# Patient Record
Sex: Female | Born: 2000 | Race: Black or African American | Hispanic: No | Marital: Single | Smoking: Never smoker
Health system: Southern US, Community
[De-identification: ages and names within clinical notes are randomized; demographics above are authoritative.]

## PROBLEM LIST (undated history)

## (undated) ENCOUNTER — Emergency Department (HOSPITAL_COMMUNITY): Admission: EM | Payer: Medicaid Other | Source: Home / Self Care

## (undated) DIAGNOSIS — L309 Dermatitis, unspecified: Secondary | ICD-10-CM

## (undated) DIAGNOSIS — J45909 Unspecified asthma, uncomplicated: Secondary | ICD-10-CM

---
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
## 2023-03-31 ENCOUNTER — Emergency Department
Admission: EM | Admit: 2023-03-31 | Discharge: 2023-03-31 | Disposition: A | Discharge status: Home / Self Care | Attending: Emergency Medicine | Admitting: Emergency Medicine

## 2023-03-31 ENCOUNTER — Other Ambulatory Visit: Payer: Self-pay

## 2023-03-31 DIAGNOSIS — J039 Acute tonsillitis, unspecified: Secondary | ICD-10-CM | POA: Insufficient documentation

## 2023-03-31 DIAGNOSIS — J029 Acute pharyngitis, unspecified: Secondary | ICD-10-CM

## 2023-03-31 LAB — CBC
HCT: 37.8 % (ref 36.0–46.0)
Hemoglobin: 12.8 g/dL (ref 12.0–15.0)
MCH: 29.2 pg (ref 26.0–34.0)
MCHC: 33.9 g/dL (ref 30.0–36.0)
MCV: 86.3 fL (ref 80.0–100.0)
Platelets: 235 10*3/uL (ref 150–400)
RBC: 4.38 MIL/uL (ref 3.87–5.11)
RDW: 13.2 % (ref 11.5–15.5)
WBC: 8.8 10*3/uL (ref 4.0–10.5)
nRBC: 0 % (ref 0.0–0.2)

## 2023-03-31 LAB — GROUP A STREP BY PCR: Group A Strep by PCR: NOT DETECTED

## 2023-03-31 LAB — MONONUCLEOSIS SCREEN: Mono Screen: NEGATIVE

## 2023-03-31 MED ORDER — PENICILLIN G BENZATHINE 1200000 UNIT/2ML IM SUSY
600000.0000 [IU] | PREFILLED_SYRINGE | Freq: Once | INTRAMUSCULAR | Status: AC
  Administered 2023-03-31: 600000 [IU] via INTRAMUSCULAR
  Filled 2023-03-31: qty 2

## 2023-03-31 MED ORDER — LIDOCAINE VISCOUS HCL 2 % MT SOLN
15.0000 mL | Freq: Once | OROMUCOSAL | Status: AC
  Administered 2023-03-31: 15 mL via OROMUCOSAL
  Filled 2023-03-31: qty 15

## 2023-03-31 NOTE — Discharge Instructions (Addendum)
 Your evaluated in the ED for sore throat.  Your rapid strep test is negative.  Your mono screening and strep culture is still pending.  If these are positive you will receive a call with an update of these results.  A penicillin injection was administered given your physical exam being consistent with strep pharyngitis.  Please follow-up with ENT for further evaluation.

## 2023-03-31 NOTE — ED Triage Notes (Signed)
 Pt sts that she has been having enlarged tonsil for many moths and keeps on getting a sore throat. Pt has a referral to see an ENT but has not made an apt.

## 2023-03-31 NOTE — ED Provider Notes (Signed)
 [facility] Emergency Department Provider Note     Event Date/Time   First MD Initiated Contact with Patient 03/31/23 1142     (approximate)   History   Sore Throat   HPI  Laura Shelton is a 23 y.o. female presents to the ED for evaluation of sore throat x 1 week.  Patient reports the sore throat has been ongoing for a while now and she has been evaluated by her school doctor who has tested her for strep and mononucleosis in the past and nothing has been positive.  She reports intermittent pain with swallowing and discomfort.  She reports she has a ENT referral but has yet to make an appointment.  Denies fever and chills.     Physical Exam   Triage Vital Signs: ED Triage Vitals  Encounter Vitals Group     BP 03/31/23 1128 113/84     Systolic BP Percentile --      Diastolic BP Percentile --      Pulse Rate 03/31/23 1128 (!) 117     Resp 03/31/23 1128 17     Temp 03/31/23 1128 97.8 F (36.6 C)     Temp Source 03/31/23 1128 Oral     SpO2 03/31/23 1128 98 %     Weight 03/31/23 1129 107 lb (48.5 kg)     Height 03/31/23 1129 5\' 2"  (1.575 m)     Head Circumference --      Peak Flow --      Pain Score 03/31/23 1129 6     Pain Loc --      Pain Education --      Exclude from Growth Chart --     Most recent vital signs: Vitals:   03/31/23 1128  BP: 113/84  Pulse: (!) 117  Resp: 17  Temp: 97.8 F (36.6 C)  SpO2: 98%    General: Well appearing. Alert and oriented. INAD.  Speaking in complete sentences. Head:  NCAT.  Eyes:  PERRLA. EOMI.  Throat: Oropharynx clear. No erythema. Exudates noted bilaterally. Tonsils are enlarged bilaterally. Uvula is midline. CV:  Good peripheral perfusion. RRR.  RESP:  Normal effort.   ED Results / Procedures / Treatments   Labs (all labs ordered are listed, but only abnormal results are displayed) Labs Reviewed  GROUP A STREP BY PCR  CULTURE, GROUP A STREP [facility])  CBC  MONONUCLEOSIS SCREEN   No  results found.  PROCEDURES:  Critical Care performed: No  Procedures  MEDICATIONS ORDERED IN ED: Medications  lidocaine (XYLOCAINE) 2 % viscous mouth solution 15 mL (15 mLs Mouth/Throat Given 03/31/23 1256)  penicillin g benzathine (BICILLIN LA) 1200000 UNIT/2ML injection 600,000 Units (600,000 Units Intramuscular Given 03/31/23 1359)    IMPRESSION / MDM / ASSESSMENT AND PLAN / ED COURSE  I reviewed the triage vital signs and the nursing notes.                               23 y.o. female presents to the emergency department for evaluation and treatment of acute on chronic sore throat. See HPI for further details.   Differential diagnosis includes, but is not limited to strep pharyngitis, tonsillitis, tonsil stones, mono  Patient's presentation is most consistent with acute complicated illness / injury requiring diagnostic workup.  Patient is alert and oriented.  She is hemodynamically stable and afebrile.  Physical exam findings are as stated above and pertinent for  bilateral exudates and tonsil enlargement.  On history intake we discussed if she has ever had tonsil stones as presentation is very similar.  She reports she has had this in the past not recently.  Rapid strep test is negative, mono screening was negative.  Lab work is reassuring.  Given her exudates, I will treat with penicillin injection and obtain a culture group A strep.   Lidocaine viscous was administered for discomfort.  Thorough discussion with patient was had for follow-up with ENT for further evaluation.  Patient verbalized understanding and reports will appointment tomorrow.  Patient is in stable condition for discharge home.  ED return precautions are discussed. All questions and concerns were addressed during this ED visit.     FINAL CLINICAL IMPRESSION(S) / ED DIAGNOSES   Final diagnoses:  Sore throat  Tonsillitis    Rx / DC Orders   ED Discharge Orders     None        Note:  This document was  prepared using Dragon voice recognition software and may include unintentional dictation errors.    Romeo Apple, Artis Beggs A, PA-C 03/31/23 1633    Sharman Cheek, MD 03/31/23 [phone]

## 2023-04-03 LAB — CULTURE, GROUP A STREP (THRC)

## 2023-05-20 IMAGING — CR DG CHEST 2V
2 series · 2 of 2 positions shown · non-contrast
Comparison: None.

CLINICAL DATA: Pleuritic chest pain with cough and shortness of
breath.

EXAM:
CHEST - 2 VIEW

[chest pa]
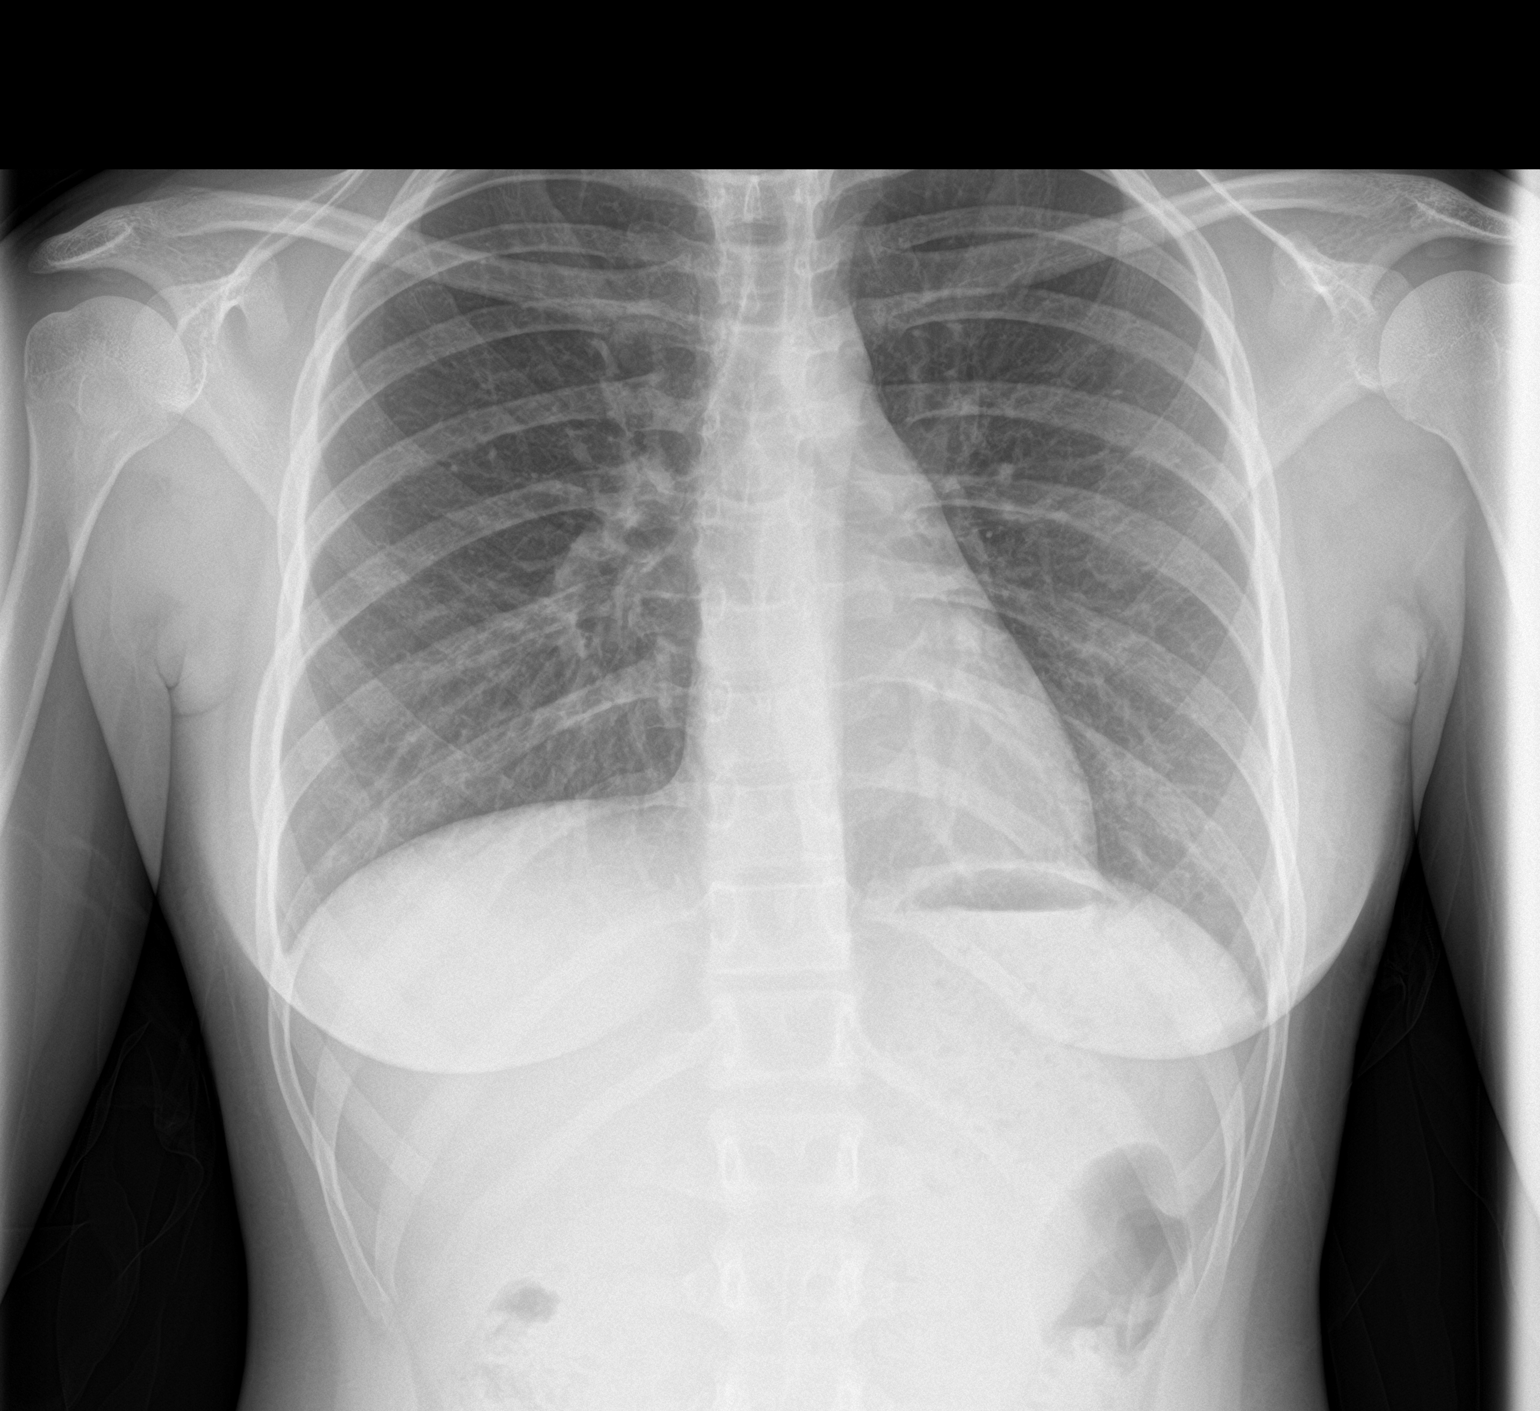

[chest lat]
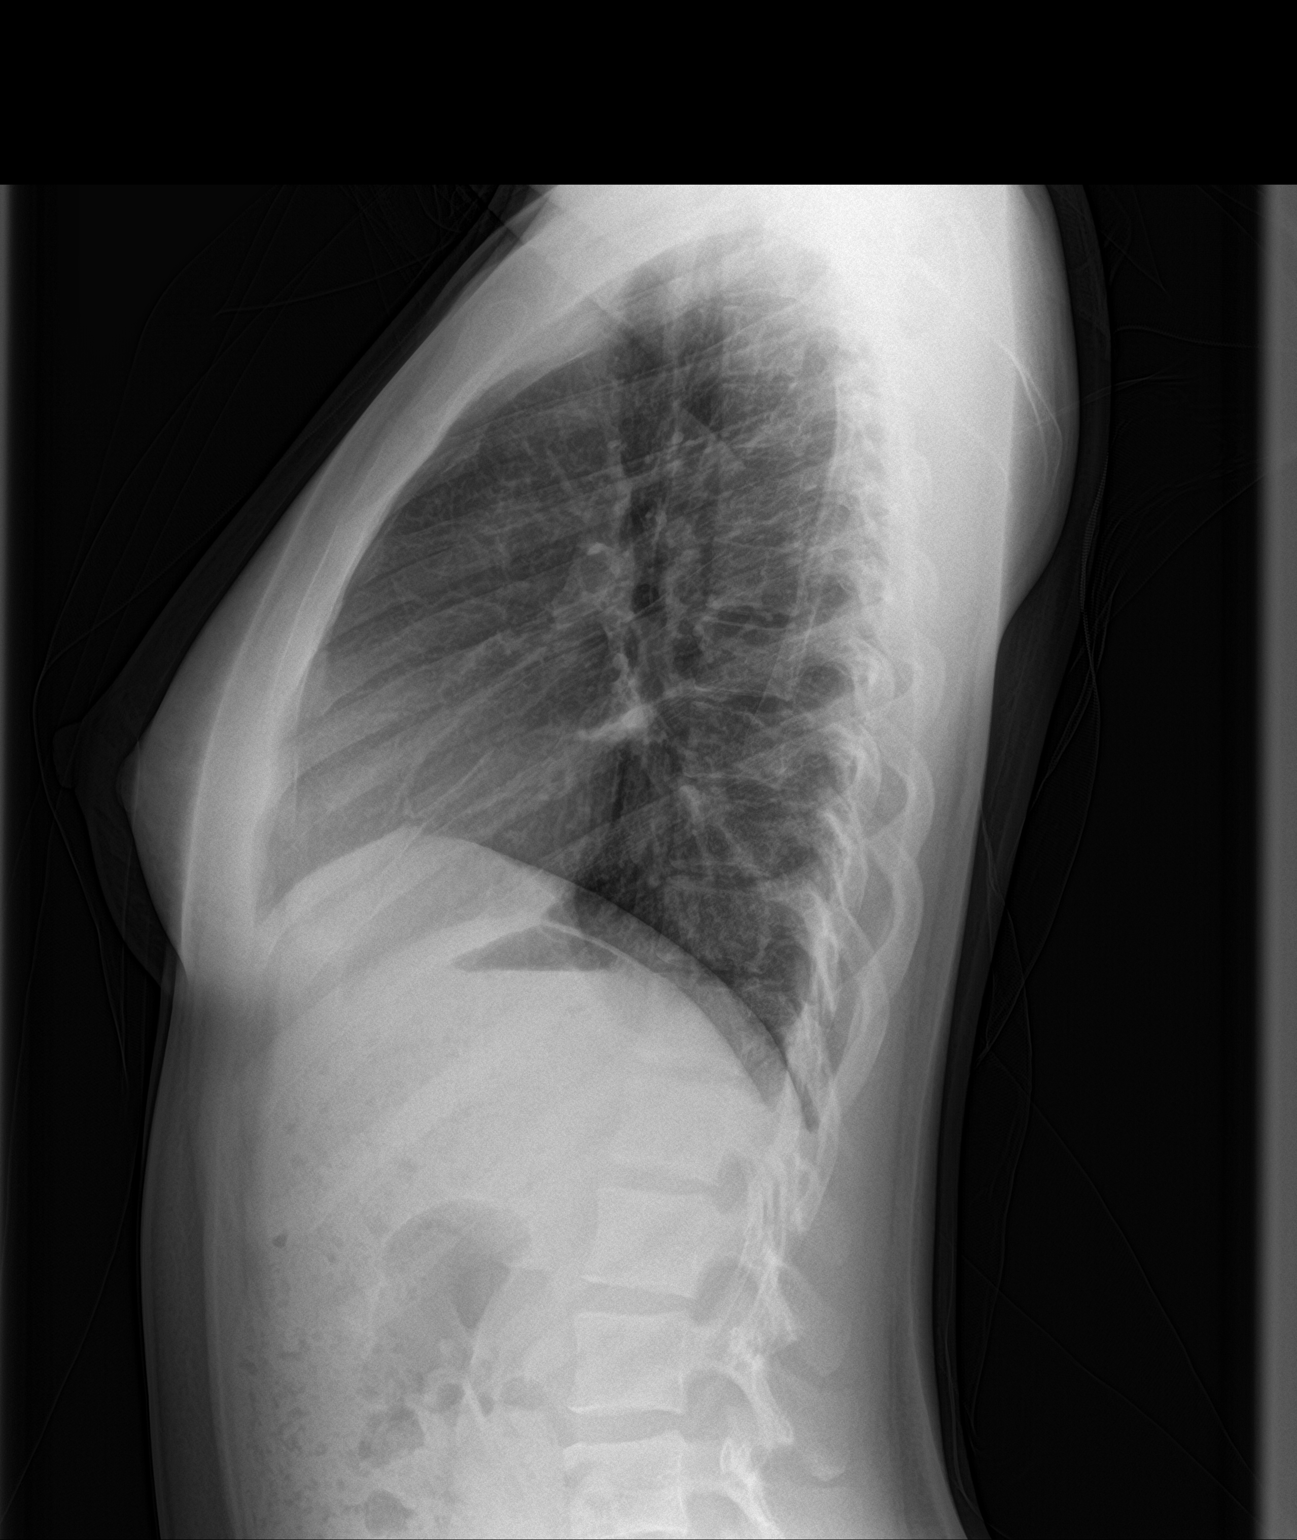

[2 of 2 positions shown; findings below may reference images not displayed]

FINDINGS: The heart size and mediastinal contours are within normal limits.
Both lungs are clear. The visualized skeletal structures are
unremarkable.
IMPRESSION: No active cardiopulmonary disease.
